# Patient Record
Sex: Male | Born: 1990 | Race: White | Hispanic: No | Marital: Married | Smoking: Never smoker
Health system: Southern US, Community
[De-identification: ages and names within clinical notes are randomized; demographics above are authoritative.]

## PROBLEM LIST (undated history)

## (undated) DIAGNOSIS — H209 Unspecified iridocyclitis: Secondary | ICD-10-CM

---
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
## 2016-11-05 ENCOUNTER — Encounter (HOSPITAL_COMMUNITY): Payer: Self-pay | Admitting: Family Medicine

## 2016-11-05 DIAGNOSIS — K529 Noninfective gastroenteritis and colitis, unspecified: Secondary | ICD-10-CM | POA: Insufficient documentation

## 2016-11-05 DIAGNOSIS — K625 Hemorrhage of anus and rectum: Secondary | ICD-10-CM | POA: Diagnosis present

## 2016-11-05 LAB — TYPE AND SCREEN
ABO/RH(D): A POS
Antibody Screen: NEGATIVE

## 2016-11-05 LAB — CBC
HEMATOCRIT: 46 % (ref 39.0–52.0)
HEMOGLOBIN: 16.6 g/dL (ref 13.0–17.0)
MCH: 30.5 pg (ref 26.0–34.0)
MCHC: 36.1 g/dL — AB (ref 30.0–36.0)
MCV: 84.6 fL (ref 78.0–100.0)
Platelets: 203 10*3/uL (ref 150–400)
RBC: 5.44 MIL/uL (ref 4.22–5.81)
RDW: 12.8 % (ref 11.5–15.5)
WBC: 11 10*3/uL — ABNORMAL HIGH (ref 4.0–10.5)

## 2016-11-05 LAB — COMPREHENSIVE METABOLIC PANEL
ALBUMIN: 4.7 g/dL (ref 3.5–5.0)
ALT: 20 U/L (ref 17–63)
ANION GAP: 9 (ref 5–15)
AST: 25 U/L (ref 15–41)
Alkaline Phosphatase: 53 U/L (ref 38–126)
BUN: 23 mg/dL — AB (ref 6–20)
CO2: 25 mmol/L (ref 22–32)
Calcium: 9.6 mg/dL (ref 8.9–10.3)
Chloride: 100 mmol/L — ABNORMAL LOW (ref 101–111)
Creatinine, Ser: 1.17 mg/dL (ref 0.61–1.24)
GFR calc Af Amer: >60 mL/min (ref >60)
GFR calc non Af Amer: >60 mL/min (ref >60)
GLUCOSE: 104 mg/dL — AB (ref 65–99)
POTASSIUM: 4.5 mmol/L (ref 3.5–5.1)
SODIUM: 134 mmol/L — AB (ref 135–145)
Total Bilirubin: 0.7 mg/dL (ref 0.3–1.2)
Total Protein: 7.8 g/dL (ref 6.5–8.1)

## 2016-11-05 NOTE — ED Triage Notes (Signed)
Patient reports yesterday he developed nausea, vomiting, diarrhea, and "delusional". Pt reports relates these symptoms could related to food poison. Today, his stools changed from brown to dark, brown to black. Pt reports mild lightheadedness.

## 2016-11-06 ENCOUNTER — Emergency Department (HOSPITAL_COMMUNITY)
Admission: EM | Admit: 2016-11-06 | Discharge: 2016-11-06 | Disposition: A | Discharge status: Home / Self Care | Payer: BC Managed Care – PPO | Attending: Emergency Medicine | Admitting: Emergency Medicine

## 2016-11-06 DIAGNOSIS — K529 Noninfective gastroenteritis and colitis, unspecified: Secondary | ICD-10-CM | POA: Diagnosis not present

## 2016-11-06 HISTORY — DX: Unspecified iridocyclitis: H20.9

## 2016-11-06 LAB — ABO/RH: ABO/RH(D): A POS

## 2016-11-06 LAB — POC OCCULT BLOOD, ED: FECAL OCCULT BLD: NEGATIVE

## 2016-11-06 MED ORDER — DIPHENOXYLATE-ATROPINE 2.5-0.025 MG PO TABS: 2.0000 | ORAL_TABLET | Freq: Four times a day (QID) | ORAL | 0 refills | Status: AC | PRN

## 2016-11-06 MED ORDER — SODIUM CHLORIDE 0.9 % IV BOLUS (SEPSIS)
1000.0000 mL | Freq: Once | INTRAVENOUS | Status: AC
  Administered 2016-11-06: 1000 mL via INTRAVENOUS

## 2016-11-06 MED ORDER — PANTOPRAZOLE SODIUM 40 MG IV SOLR
40.0000 mg | Freq: Once | INTRAVENOUS | Status: AC
  Administered 2016-11-06: 40 mg via INTRAVENOUS
  Filled 2016-11-06: qty 40

## 2016-11-06 MED ORDER — ONDANSETRON HCL 4 MG PO TABS: 4.0000 mg | ORAL_TABLET | Freq: Four times a day (QID) | ORAL | 0 refills | Status: AC

## 2016-11-06 MED ORDER — SODIUM CHLORIDE 0.9 % IV BOLUS (SEPSIS)
1000.0000 mL | Freq: Once | INTRAVENOUS | Status: AC
Start: 2016-11-06 — End: 2016-11-06
  Administered 2016-11-06: 1000 mL via INTRAVENOUS

## 2016-11-06 MED ORDER — ONDANSETRON HCL 4 MG/2ML IJ SOLN
4.0000 mg | Freq: Once | INTRAMUSCULAR | Status: AC
  Administered 2016-11-06: 4 mg via INTRAVENOUS
  Filled 2016-11-06: qty 2

## 2016-11-06 NOTE — ED Provider Notes (Signed)
[facility]-EMERGENCY DEPT Provider Note   CSN: [id number] Arrival date & time: 11/05/16  2019  By signing my name below, I, Phillips ClimesFabiola de Louis, attest that this documentation has been prepared under the direction and in the presence of Pollina, Canary BrimChristopher J, * . Electronically Signed: Phillips ClimesFabiola de Louis, Scribe. 11/06/2016. 2:14 AM.  History   Chief Complaint Chief Complaint  Patient presents with  . Rectal Bleeding   HPI Comments Keith Todd is a 26 y.o. male with no reported PMHx, who presents to the Emergency Department with complaints of nausea, vomiting and diarrhea x1day.  Associated fatigue and lightheadedness.  Vomiting has resolved.  "Light" nausea.  Diarrhea has persisted.  No abdominal pain. Today, pt reports dark stools, and PCP recommended that he come to ED for further work-up.  Pt denies experiencing any other acute sx.  The history is provided by the patient. No language interpreter was used.   Past Medical History:  Diagnosis Date  . Uveitis     There are no active problems to display for this patient.   History reviewed. No pertinent surgical history.     Home Medications    Prior to Admission medications   Not on File    Family History History reviewed. No pertinent family history.  Social History Social History  Substance Use Topics  . Smoking status: Never Smoker  . Smokeless tobacco: Never Used  . Alcohol use No     Allergies   Patient has no allergy information on record.   Review of Systems Review of Systems  Constitutional: Positive for fatigue. Negative for chills and fever.  HENT: Negative for trouble swallowing.   Respiratory: Negative for chest tightness and shortness of breath.   Cardiovascular: Negative for chest pain.  Gastrointestinal: Positive for diarrhea, nausea and vomiting (Resolved). Negative for abdominal pain.  Genitourinary: Negative for difficulty urinating.  Skin: Negative for pallor.  Neurological: Positive for  light-headedness. Negative for weakness and numbness.  All other systems reviewed and are negative.   Physical Exam Updated Vital Signs BP 119/82 (BP Location: Left Arm)   Pulse 75   Temp 97.6 F (36.4 C) (Oral)   Resp 17   Ht 6' (1.829 m)   Wt 72.6 kg (160 lb)   SpO2 100%   BMI 21.70 kg/m   Physical Exam  Constitutional: He is oriented to person, place, and time. He appears well-developed and well-nourished. No distress.  HENT:  Head: Normocephalic and atraumatic.  Right Ear: Hearing normal.  Left Ear: Hearing normal.  Nose: Nose normal.  Mouth/Throat: Oropharynx is clear and moist and mucous membranes are normal.  Eyes: Conjunctivae and EOM are normal. Pupils are equal, round, and reactive to light.  Neck: Normal range of motion. Neck supple.  Cardiovascular: Regular rhythm, S1 normal and S2 normal.  Exam reveals no gallop and no friction rub.   No murmur heard. Pulmonary/Chest: Effort normal and breath sounds normal. No respiratory distress. He exhibits no tenderness.  Abdominal: Soft. Normal appearance and bowel sounds are normal. There is no hepatosplenomegaly. There is no tenderness. There is no rebound, no guarding, no tenderness at McBurney's point and negative Murphy's sign. No hernia.  Musculoskeletal: Normal range of motion.  Neurological: He is alert and oriented to person, place, and time. He has normal strength. No cranial nerve deficit or sensory deficit. Coordination normal. GCS eye subscore is 4. GCS verbal subscore is 5. GCS motor subscore is 6.  Skin: Skin is warm, dry and intact. No rash noted.  No cyanosis.  Psychiatric: He has a normal mood and affect. His speech is normal and behavior is normal. Thought content normal.  Nursing note and vitals reviewed.  ED Treatments / Results  DIAGNOSTIC STUDIES: Oxygen Saturation is 100% on room air, normal by my interpretation.    COORDINATION OF CARE: 1:41 AM Discussed treatment plan with pt at bedside and pt  agreed to plan.  Labs (all labs ordered are listed, but only abnormal results are displayed) Labs Reviewed  COMPREHENSIVE METABOLIC PANEL - Abnormal; Notable for the following:       Result Value   Sodium 134 (*)    Chloride 100 (*)    Glucose, Bld 104 (*)    BUN 23 (*)    All other components within normal limits  CBC - Abnormal; Notable for the following:    WBC 11.0 (*)    MCHC 36.1 (*)    All other components within normal limits  POC OCCULT BLOOD, ED  TYPE AND SCREEN  ABO/RH    EKG  EKG Interpretation None       Radiology No results found.  Procedures Procedures (including critical care time)  Medications Ordered in ED Medications  sodium chloride 0.9 % bolus 1,000 mL (not administered)    Followed by  sodium chloride 0.9 % bolus 1,000 mL (not administered)  ondansetron (ZOFRAN) injection 4 mg (not administered)  pantoprazole (PROTONIX) injection 40 mg (not administered)     Initial Impression / Assessment and Plan / ED Course  I have reviewed the triage vital signs and the nursing notes.  Pertinent labs & imaging results that were available during my care of the patient were reviewed by me and considered in my medical decision making (see chart for details).     Patient presents to emergency department for evaluation of nausea, vomiting, diarrhea. Symptoms were present for 24 hours. He reports that he has not had any vomiting for the last several hours but continues to have diarrhea. His diarrhea has turned dark, was referred to the ER by his doctor to rule out GI bleeding. Hemoccult did reveal dark stool that was heme-negative. This is felt to be secondary to Pepto-Bismol use. Lab work was otherwise unremarkable. Abdominal exam is benign, nontender. Patient hydrated and will continue symptomatically treatment.   I personally performed the services described in this documentation, which was scribed in my presence. The recorded information has been reviewed  and is accurate.  Final Clinical Impressions(s) / ED Diagnoses   Final diagnoses:  Gastroenteritis    New Prescriptions New Prescriptions   No medications on file     Gilda Crease, MD 11/06/16 [phone]

## 2019-07-30 ENCOUNTER — Ambulatory Visit: Payer: BC Managed Care – PPO | Attending: Internal Medicine

## 2019-07-30 DIAGNOSIS — Z23 Encounter for immunization: Secondary | ICD-10-CM

## 2019-07-30 NOTE — Progress Notes (Signed)
   Covid-19 Vaccination Clinic  Name:  Keith Todd    MRN: [id number] DOB: 1990/09/09  07/30/2019  [name] was observed post Covid-19 immunization for 15 minutes without incident. He was provided with Vaccine Information Sheet and instruction to access the V-Safe system.   [name] was instructed to call 911 with any severe reactions post vaccine: Marland Kitchen Difficulty breathing  . Swelling of face and throat  . A fast heartbeat  . A bad rash all over body  . Dizziness and weakness   Immunizations Administered    Name Date Dose VIS Date Route   Moderna COVID-19 Vaccine 07/30/2019 10:25 AM 0.5 mL 04/14/2019 Intramuscular   Manufacturer: Moderna   Lot: [id number]   NDC: 09323-557-32

## 2019-09-01 ENCOUNTER — Ambulatory Visit: Payer: BC Managed Care – PPO | Attending: Family

## 2019-09-01 DIAGNOSIS — Z23 Encounter for immunization: Secondary | ICD-10-CM

## 2019-09-01 NOTE — Progress Notes (Signed)
   Covid-19 Vaccination Clinic  Name:  Mang Hazelrigg    MRN: [id number] DOB: 03/25/1991  09/01/2019  Mr. Kloepfer was observed post Covid-19 immunization for 15 minutes without incident. He was provided with Vaccine Information Sheet and instruction to access the V-Safe system.   Mr. Groh was instructed to call 911 with any severe reactions post vaccine: Marland Kitchen Difficulty breathing  . Swelling of face and throat  . A fast heartbeat  . A bad rash all over body  . Dizziness and weakness   Immunizations Administered    Name Date Dose VIS Date Route   Moderna COVID-19 Vaccine 09/01/2019  4:06 PM 0.5 mL 04/2019 Intramuscular   Manufacturer: Moderna   Lot: [id number]   NDC: 83462-194-71

## 2020-05-20 ENCOUNTER — Ambulatory Visit: Payer: BC Managed Care – PPO | Attending: Family

## 2020-05-20 DIAGNOSIS — Z23 Encounter for immunization: Secondary | ICD-10-CM

## 2020-09-14 NOTE — Progress Notes (Signed)
   Covid-19 Vaccination Clinic  Name:  Keith Todd    MRN: [id number] DOB: Aug 01, 1990  09/14/2020  [name] was observed post Covid-19 immunization for 15 minutes without incident. He was provided with Vaccine Information Sheet and instruction to access the V-Safe system.   [name] was instructed to call 911 with any severe reactions post vaccine: Marland Kitchen Difficulty breathing  . Swelling of face and throat  . A fast heartbeat  . A bad rash all over body  . Dizziness and weakness   Immunizations Administered    Name Date Dose VIS Date Route   Moderna Covid-19 Booster Vaccine 05/20/2020 12:30 PM 0.25 mL 03/02/2020 Intramuscular   Manufacturer: Moderna   Lot: [id number]   NDC: 45859-292-44

## 2020-10-06 ENCOUNTER — Other Ambulatory Visit (HOSPITAL_COMMUNITY): Payer: Self-pay | Admitting: Internal Medicine

## 2020-10-06 DIAGNOSIS — M545 Low back pain, unspecified: Secondary | ICD-10-CM

## 2020-10-07 ENCOUNTER — Ambulatory Visit (HOSPITAL_COMMUNITY)
Admission: RE | Admit: 2020-10-07 | Discharge: 2020-10-07 | Disposition: A | Discharge status: Home / Self Care | Payer: BC Managed Care – PPO | Source: Ambulatory Visit | Attending: Internal Medicine | Admitting: Internal Medicine

## 2020-10-07 ENCOUNTER — Other Ambulatory Visit: Payer: Self-pay

## 2020-10-07 DIAGNOSIS — M545 Low back pain, unspecified: Secondary | ICD-10-CM | POA: Insufficient documentation

## 2021-11-13 IMAGING — MR MR LUMBAR SPINE W/O CM
5 of 8 series · 22 of 48 positions shown · non-contrast
Comparison: None.

CLINICAL DATA: Low back pain

EXAM:
MRI LUMBAR SPINE WITHOUT CONTRAST
TECHNIQUE: Multiplanar, multisequence MR imaging of the lumbar spine was
performed. No intravenous contrast was administered.

[Series 5: T1 · sagittal · 4.0mm · 0.86mm/px · 2 of 15 slices shown (1 of 2)]
[im 1/15]
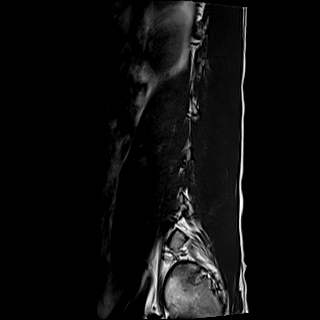
[im 15/15]
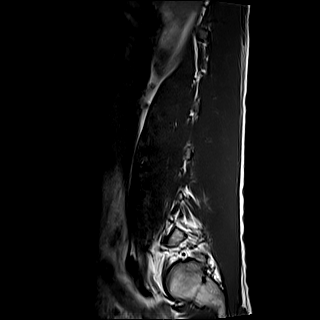

[Series 6: T2 · sagittal · 4.0mm · 0.86mm/px · 2 of 15 slices shown (1 of 2)]
[im 1/15]
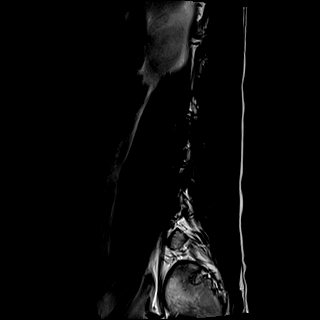
[im 15/15]
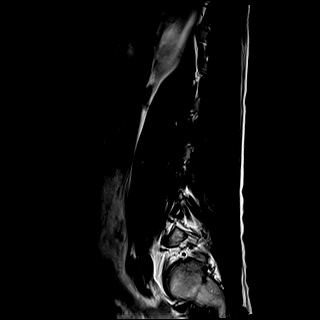

[Series 7: STIR · sagittal · 4.0mm · 0.54mm/px · 2 of 15 slices shown]
[im 1/15]
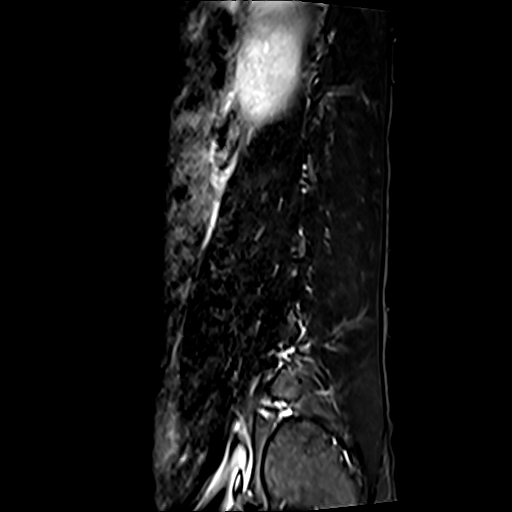
[im 15/15]
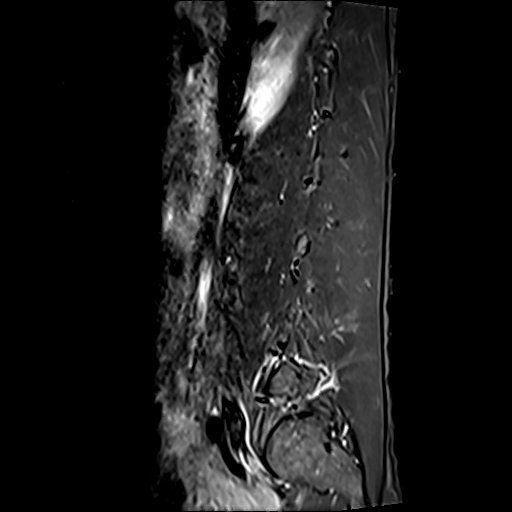

[Series 8: T2 · axial · 4.0mm · 0.62mm/px · z∈[-181,+65]mm · 8 of 48 slices shown (2 of 2)]
[im 1/48]
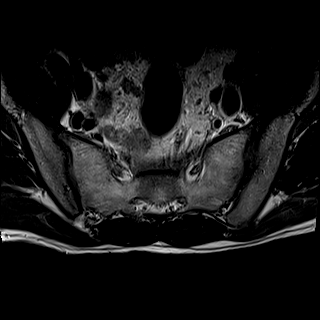
[im 7/48]
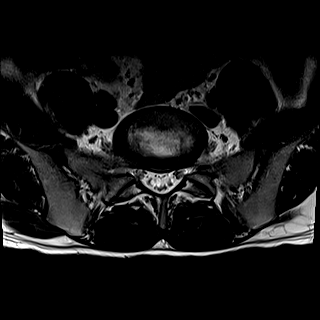
[im 14/48]
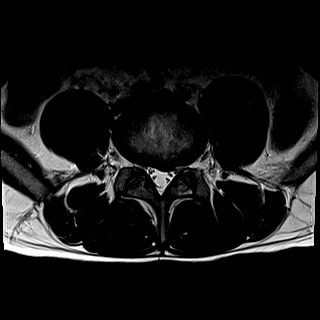
[im 21/48]
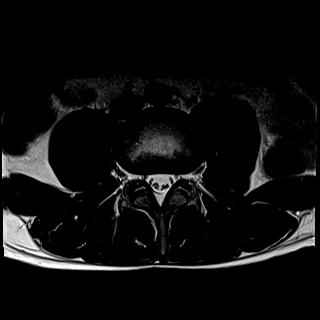
[im 27/48]
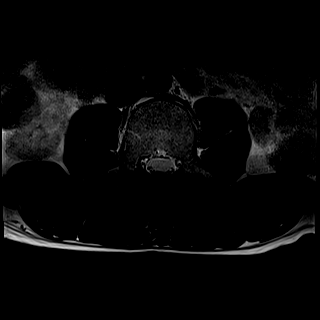
[im 34/48]
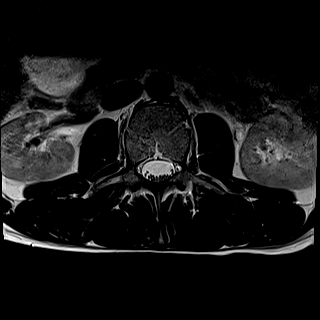
[im 41/48]
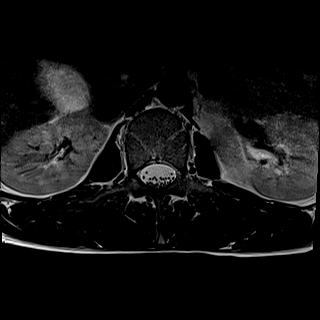
[im 48/48]
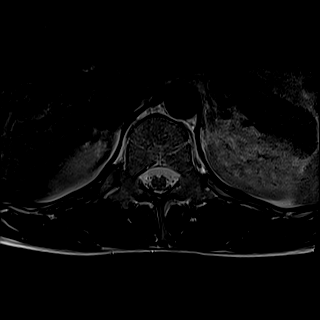

[Series 9: T1 · axial · 4.0mm · 0.39mm/px · z∈[-181,+65]mm · 8 of 48 slices shown (2 of 2)]
[im 1/48]
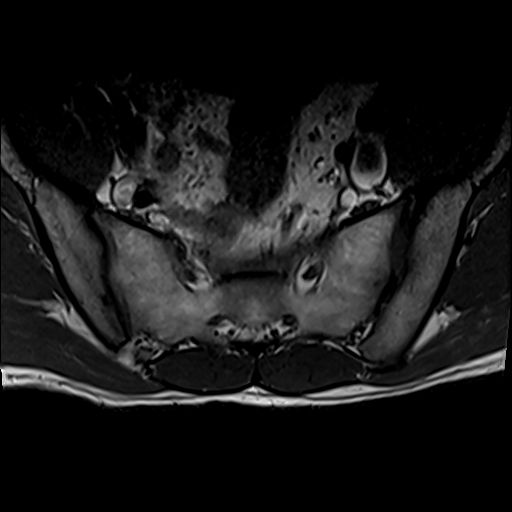
[im 7/48]
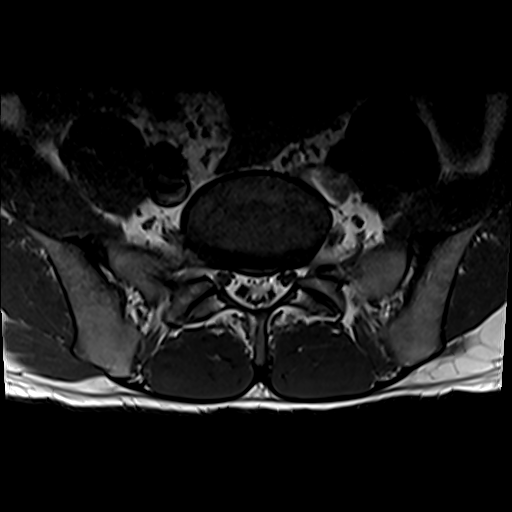
[im 14/48]
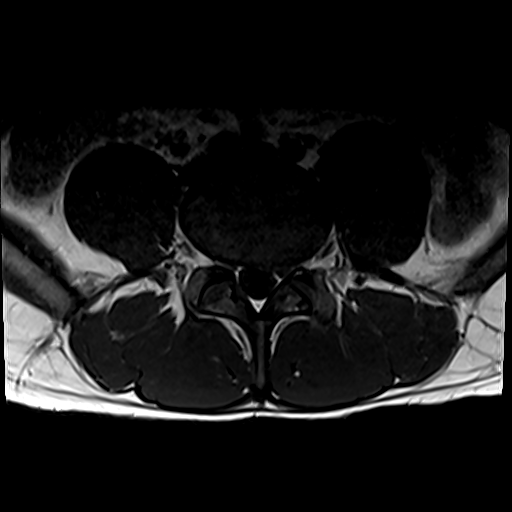
[im 21/48]
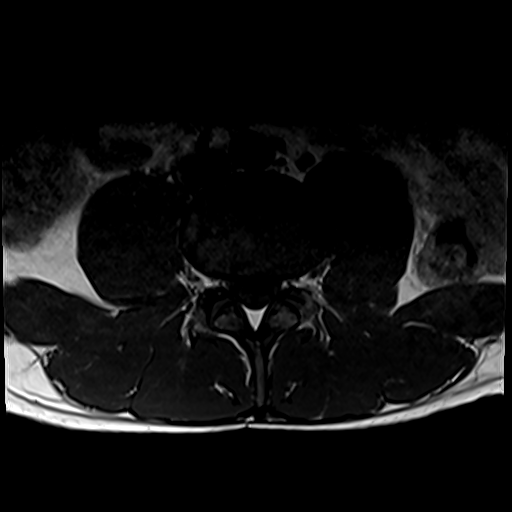
[im 27/48]
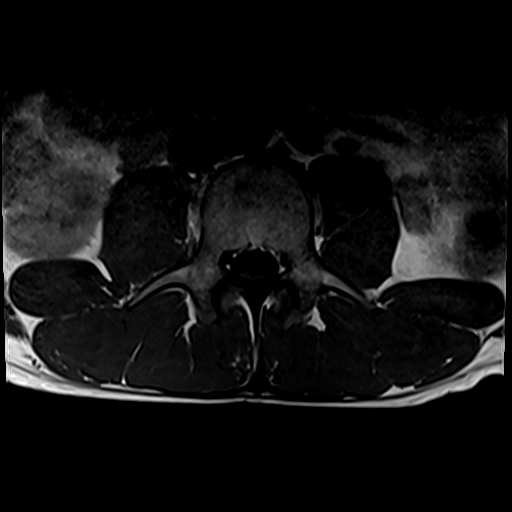
[im 34/48]
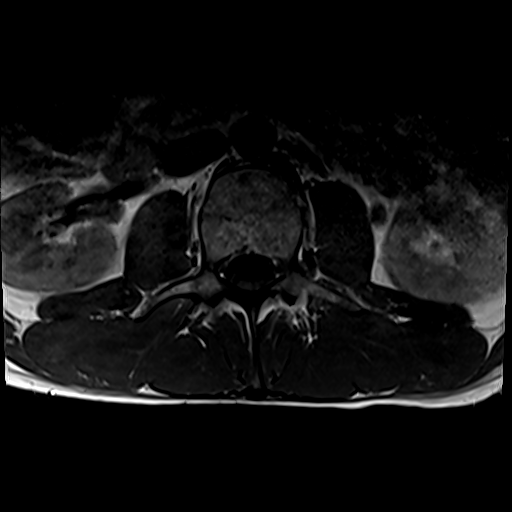
[im 41/48]
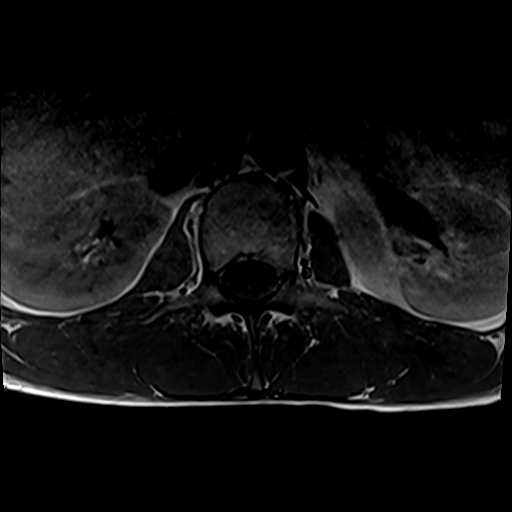
[im 48/48]
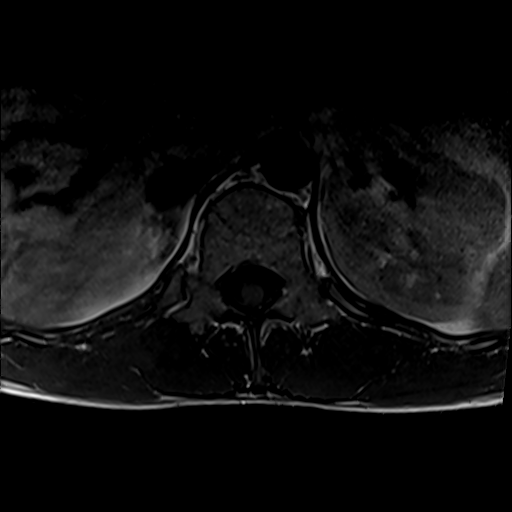

[22 of 48 positions shown; findings below may reference images not displayed]

FINDINGS: Segmentation:  Normal

Alignment:  Normal

Vertebrae:  Normal bone marrow.  Negative for fracture or mass.

Conus medullaris and cauda equina: Conus extends to the L1 level.
Conus and cauda equina appear normal.

Paraspinal and other soft tissues: Negative for paraspinous mass or
adenopathy or fluid collection.

Disc levels:

L1-2: Normal

L2-3: Normal

L3-4: Mild disc bulging and mild facet degeneration. Negative for
disc protrusion or stenosis

L4-5: Left paracentral disc protrusion. Subarticular stenosis and
impingement left L5 nerve root. There is also milder right L5 nerve
root impingement in the subarticular zone. Mild narrowing of the
spinal canal

L5-S1: Negative
IMPRESSION: Left paracentral disc protrusion at L4-5 with left L5 nerve root
impingement. Milder right L5 nerve root impingement
